# Patient Record
Sex: Male | Born: 1995 | Race: Black or African American | Hispanic: No | Marital: Single | Smoking: Never smoker
Health system: Southern US, Community
[De-identification: ages and names within clinical notes are randomized; demographics above are authoritative.]

---
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
## 2017-01-02 ENCOUNTER — Encounter: Payer: Self-pay | Admitting: Family Medicine

## 2017-01-02 ENCOUNTER — Ambulatory Visit (INDEPENDENT_AMBULATORY_CARE_PROVIDER_SITE_OTHER): Payer: Managed Care, Other (non HMO) | Admitting: Family Medicine

## 2017-01-02 DIAGNOSIS — L989 Disorder of the skin and subcutaneous tissue, unspecified: Secondary | ICD-10-CM

## 2017-01-02 NOTE — Progress Notes (Signed)
Patient presents today for follow-up regarding lesion on right antecubital area. Cryotherapy was used on the area however has not worked. I have discussed with the patient that I will refer him to dermatology to have the area excised. Patient just understanding. Will follow up with me if he has any acute problems.

## 2017-02-11 ENCOUNTER — Ambulatory Visit
Admission: RE | Admit: 2017-02-11 | Discharge: 2017-02-11 | Disposition: A | Discharge status: Home / Self Care | Payer: 59 | Source: Ambulatory Visit | Attending: Family Medicine | Admitting: Family Medicine

## 2017-02-11 ENCOUNTER — Other Ambulatory Visit: Payer: Self-pay | Admitting: Family Medicine

## 2017-02-11 DIAGNOSIS — Y9361 Activity, american tackle football: Secondary | ICD-10-CM | POA: Diagnosis not present

## 2017-02-11 DIAGNOSIS — R937 Abnormal findings on diagnostic imaging of other parts of musculoskeletal system: Secondary | ICD-10-CM | POA: Insufficient documentation

## 2017-02-11 DIAGNOSIS — R609 Edema, unspecified: Secondary | ICD-10-CM

## 2017-02-11 DIAGNOSIS — R52 Pain, unspecified: Secondary | ICD-10-CM

## 2017-02-11 DIAGNOSIS — X58XXXA Exposure to other specified factors, initial encounter: Secondary | ICD-10-CM | POA: Insufficient documentation

## 2017-02-11 DIAGNOSIS — M7989 Other specified soft tissue disorders: Secondary | ICD-10-CM | POA: Insufficient documentation

## 2017-02-11 DIAGNOSIS — M79644 Pain in right finger(s): Secondary | ICD-10-CM | POA: Insufficient documentation

## 2017-02-12 ENCOUNTER — Ambulatory Visit (INDEPENDENT_AMBULATORY_CARE_PROVIDER_SITE_OTHER): Payer: 59 | Admitting: Family Medicine

## 2017-02-12 ENCOUNTER — Encounter: Payer: Self-pay | Admitting: Family Medicine

## 2017-02-12 DIAGNOSIS — S6010XA Contusion of unspecified finger with damage to nail, initial encounter: Secondary | ICD-10-CM

## 2017-02-12 NOTE — Progress Notes (Signed)
Patient presents with right third digit pain. A helmet hit the digit 2 days ago. He has been having some throbbing under the right third nail. Denies any other injury. Has taken Ibuprofen for his pain. Xray was done to evaluate for fracture.   ROS: Negative except mentioned above. Vitals as per Epic.  GENERAL: NAD MSK: R Third Digit- subungal hematoma, tenderness on palpation at this site only, small abrasion at the cuticle area, no signs of infection, FROM at PIP and DIP, nv intact NEURO: CN II-XII grossly intact    A/P: R Third Digit Subungal Hematoma - risk/benefits were discussed, verbal consent given, local anesthetic used, electrocautery used to express blood from nail, patient expressed pain relief, dressing placed, monitor for infection, seek medical attention if any further problems. Tylenol  given to patient before discharge.

## 2017-07-17 ENCOUNTER — Ambulatory Visit (INDEPENDENT_AMBULATORY_CARE_PROVIDER_SITE_OTHER): Payer: 59 | Admitting: Family Medicine

## 2017-07-17 DIAGNOSIS — H5789 Other specified disorders of eye and adnexa: Secondary | ICD-10-CM

## 2017-07-17 MED ORDER — POLYMYXIN B-TRIMETHOPRIM 10000-0.1 UNIT/ML-% OP SOLN
1.0000 [drp] | Freq: Four times a day (QID) | OPHTHALMIC | 0 refills | Status: DC
Start: 2017-07-17 — End: 2019-04-26

## 2017-07-17 NOTE — Progress Notes (Signed)
Patient presents today with symptoms of right eye redness and tearing. Patient states that the symptoms started yesterday. He does wear contact lenses that are monthly. He denies having any issues with his contact lenses and admits to taking them out nightly. He denies feeling like there is anything in his eye. He denies any vision problems. He has his contact lens in his left eye but not his right today. He denies any URI symptoms.  ROS: Negative except mentioned above. Vitals as per Epic. GENERAL: NAD HEENT: no pharyngeal erythema, no exudate, PERRL, EOMI, right eye mild conjunctival erythema, no obvious abrasion or foreign body appreciated, no significant erythema to the left eye NEURO: CN II-XII grossly intact   A/P: Right eye irritation - discussed with patient that could have a small conjunctival abrasion, will prescribe Polytrim and advised patient to wash hands frequently, should take Claritin or Zyrtec once daily for the next few days, if symptoms do persist or worsen will have patient follow-up with eye doctor. No weight room activities today. Do not wear contact lens in the right eye until symptoms have resolved. He will seek medical attention if any acute issues.

## 2018-10-20 ENCOUNTER — Other Ambulatory Visit: Payer: Self-pay | Admitting: Hematology

## 2018-10-20 DIAGNOSIS — Z20822 Contact with and (suspected) exposure to covid-19: Secondary | ICD-10-CM

## 2018-10-20 NOTE — Progress Notes (Signed)
COVID screening per Dr. Allena Katz (PCP).  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

## 2018-10-21 ENCOUNTER — Other Ambulatory Visit: Payer: PRIVATE HEALTH INSURANCE

## 2018-10-21 DIAGNOSIS — Z20822 Contact with and (suspected) exposure to covid-19: Secondary | ICD-10-CM

## 2018-10-22 LAB — NOVEL CORONAVIRUS, NAA: SARS-CoV-2, NAA: NOT DETECTED

## 2018-11-25 ENCOUNTER — Other Ambulatory Visit: Payer: Self-pay | Admitting: *Deleted

## 2018-11-25 DIAGNOSIS — Z20822 Contact with and (suspected) exposure to covid-19: Secondary | ICD-10-CM

## 2018-11-29 ENCOUNTER — Other Ambulatory Visit: Payer: Self-pay

## 2018-11-29 DIAGNOSIS — Z20822 Contact with and (suspected) exposure to covid-19: Secondary | ICD-10-CM

## 2018-12-07 ENCOUNTER — Other Ambulatory Visit: Payer: Self-pay | Admitting: Family Medicine

## 2018-12-07 DIAGNOSIS — Z20822 Contact with and (suspected) exposure to covid-19: Secondary | ICD-10-CM

## 2018-12-07 DIAGNOSIS — Z20828 Contact with and (suspected) exposure to other viral communicable diseases: Secondary | ICD-10-CM

## 2018-12-10 ENCOUNTER — Other Ambulatory Visit: Payer: Self-pay | Admitting: Family Medicine

## 2018-12-10 LAB — NOVEL CORONAVIRUS, NAA
SARS-CoV-2, NAA: NOT DETECTED
SARS-CoV-2, NAA: NOT DETECTED

## 2019-01-03 ENCOUNTER — Other Ambulatory Visit: Payer: Self-pay

## 2019-01-03 DIAGNOSIS — Z20822 Contact with and (suspected) exposure to covid-19: Secondary | ICD-10-CM

## 2019-01-04 LAB — NOVEL CORONAVIRUS, NAA: SARS-CoV-2, NAA: NOT DETECTED

## 2019-01-05 ENCOUNTER — Telehealth: Payer: Self-pay | Admitting: Family Medicine

## 2019-01-05 ENCOUNTER — Telehealth: Payer: Self-pay

## 2019-01-05 NOTE — Telephone Encounter (Signed)
Negative COVID results given. Patient results "NOT Detected." Caller expressed understanding. ° °

## 2019-01-05 NOTE — Telephone Encounter (Signed)
Left message for Patient to call back to receive covid-19 testing results. Provided number to call.

## 2019-02-14 ENCOUNTER — Other Ambulatory Visit: Payer: Self-pay

## 2019-02-14 DIAGNOSIS — Z20822 Contact with and (suspected) exposure to covid-19: Secondary | ICD-10-CM

## 2019-02-16 LAB — NOVEL CORONAVIRUS, NAA: SARS-CoV-2, NAA: NOT DETECTED

## 2019-04-25 ENCOUNTER — Other Ambulatory Visit: Payer: Self-pay

## 2019-04-25 ENCOUNTER — Ambulatory Visit
Admission: RE | Admit: 2019-04-25 | Discharge: 2019-04-25 | Disposition: A | Discharge status: Home / Self Care | Payer: PRIVATE HEALTH INSURANCE | Source: Ambulatory Visit | Attending: Family Medicine | Admitting: Family Medicine

## 2019-04-25 ENCOUNTER — Other Ambulatory Visit: Payer: Self-pay | Admitting: Family Medicine

## 2019-04-25 DIAGNOSIS — R52 Pain, unspecified: Secondary | ICD-10-CM

## 2019-04-25 DIAGNOSIS — R609 Edema, unspecified: Secondary | ICD-10-CM | POA: Diagnosis present

## 2019-04-26 ENCOUNTER — Encounter: Payer: Self-pay | Admitting: Family Medicine

## 2019-04-26 ENCOUNTER — Ambulatory Visit: Payer: Self-pay | Admitting: Family Medicine

## 2019-04-26 DIAGNOSIS — Z113 Encounter for screening for infections with a predominantly sexual mode of transmission: Secondary | ICD-10-CM

## 2019-04-26 LAB — GRAM STAIN

## 2019-04-26 NOTE — Progress Notes (Signed)
Gram stain reviewed and is negative today so no treatment needed per standing order. Provider orders completed.Ronny Bacon, RN

## 2019-04-26 NOTE — Progress Notes (Signed)
    STI clinic/screening visit  Subjective:  Scott Bailey is a 23 y.o. male being seen today for an STI screening visit. The patient reports they do not have symptoms.  Patient reports that they do not desire a pregnancy in the next year.   They reported they are not interested in discussing contraception today.   Patient has the following medical conditions:  There are no active problems to display for this patient.  Chief Complaint  Patient presents with  . SEXUALLY TRANSMITTED DISEASE    HPI  Patient reports he is here for STD screening.  Client denies sympts.  See flowsheet for further details and programmatic requirements.    The following portions of the patient's history were reviewed and updated as appropriate: allergies, current medications, past medical history, past social history, past surgical history and problem list.  Objective:  There were no vitals filed for this visit.  Physical Exam Constitutional:      Appearance: Normal appearance.  Neck:     Musculoskeletal: Neck supple. No muscular tenderness.  Abdominal:     Palpations: Abdomen is soft.     Tenderness: There is no abdominal tenderness.  Genitourinary:    Penis: Normal.      Scrotum/Testes: Normal.     Comments: No disch noted Lymphadenopathy:     Cervical: No cervical adenopathy.  Skin:    General: Skin is warm and dry.     Findings: No lesion or rash.  Neurological:     Mental Status: He is alert.    Assessment and Plan:  Scott Bailey is a 23 y.o. male presenting to the [facility] Department for STI screening  1. Screening examination for venereal disease  - Gram stain - Gonococcus culture                                   Client declines bloodwork today Treat gram stain as per SO., Condoms always for STD prevention    No follow-ups on file.  No future appointments.  Hassell Done, FNP

## 2019-05-03 LAB — GONOCOCCUS CULTURE

## 2019-09-14 ENCOUNTER — Other Ambulatory Visit: Payer: Self-pay | Admitting: Family

## 2019-09-14 DIAGNOSIS — U071 COVID-19: Secondary | ICD-10-CM

## 2019-09-14 NOTE — Progress Notes (Signed)
Order for Complete Echo and Troponin level sent per [employer] and Sports Med protocol for Covid 19 positive athletes to return to play. 

## 2019-09-22 ENCOUNTER — Other Ambulatory Visit: Payer: Self-pay

## 2019-09-22 ENCOUNTER — Other Ambulatory Visit
Admission: RE | Admit: 2019-09-22 | Discharge: 2019-09-22 | Disposition: A | Discharge status: Home / Self Care | Payer: PRIVATE HEALTH INSURANCE | Source: Ambulatory Visit | Attending: Family | Admitting: Family

## 2019-09-22 ENCOUNTER — Ambulatory Visit (INDEPENDENT_AMBULATORY_CARE_PROVIDER_SITE_OTHER): Payer: PRIVATE HEALTH INSURANCE

## 2019-09-22 DIAGNOSIS — U071 COVID-19: Secondary | ICD-10-CM | POA: Diagnosis present

## 2019-09-22 LAB — TROPONIN I (HIGH SENSITIVITY): Troponin I (High Sensitivity): 2 ng/L (ref <18)

## 2020-02-13 ENCOUNTER — Other Ambulatory Visit: Payer: Self-pay

## 2020-02-13 ENCOUNTER — Ambulatory Visit
Admission: RE | Admit: 2020-02-13 | Discharge: 2020-02-13 | Disposition: A | Discharge status: Home / Self Care | Payer: BLUE CROSS/BLUE SHIELD | Source: Ambulatory Visit | Attending: Sports Medicine | Admitting: Sports Medicine

## 2020-02-13 ENCOUNTER — Other Ambulatory Visit: Payer: Self-pay | Admitting: Sports Medicine

## 2020-02-13 ENCOUNTER — Ambulatory Visit
Admission: RE | Admit: 2020-02-13 | Discharge: 2020-02-13 | Disposition: A | Discharge status: Home / Self Care | Payer: BLUE CROSS/BLUE SHIELD | Attending: Sports Medicine | Admitting: Sports Medicine

## 2020-02-13 DIAGNOSIS — R52 Pain, unspecified: Secondary | ICD-10-CM | POA: Diagnosis present

## 2020-08-27 IMAGING — CR DG KNEE COMPLETE 4+V*L*
1 series · 4 of 4 positions shown · non-contrast
Comparison: 09/15/2011

CLINICAL DATA: History of injury 1 month ago during football
practice still with pain in the medial knee about the patella.

EXAM:
LEFT KNEE - COMPLETE 4+ VIEW

[Series 1: dg knee complete 4 views left · 0.14mm/px · 4 of 4 slices shown]
[im 1/4]
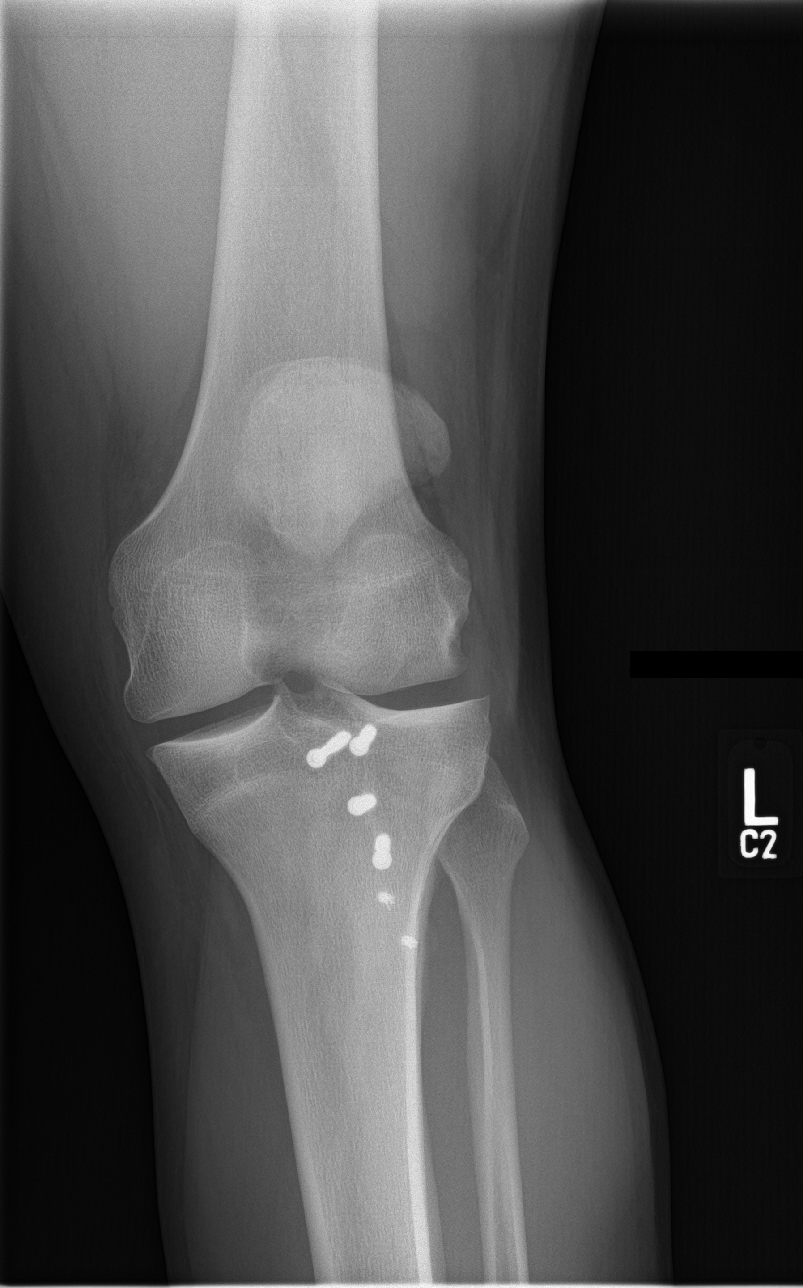
[im 2/4]
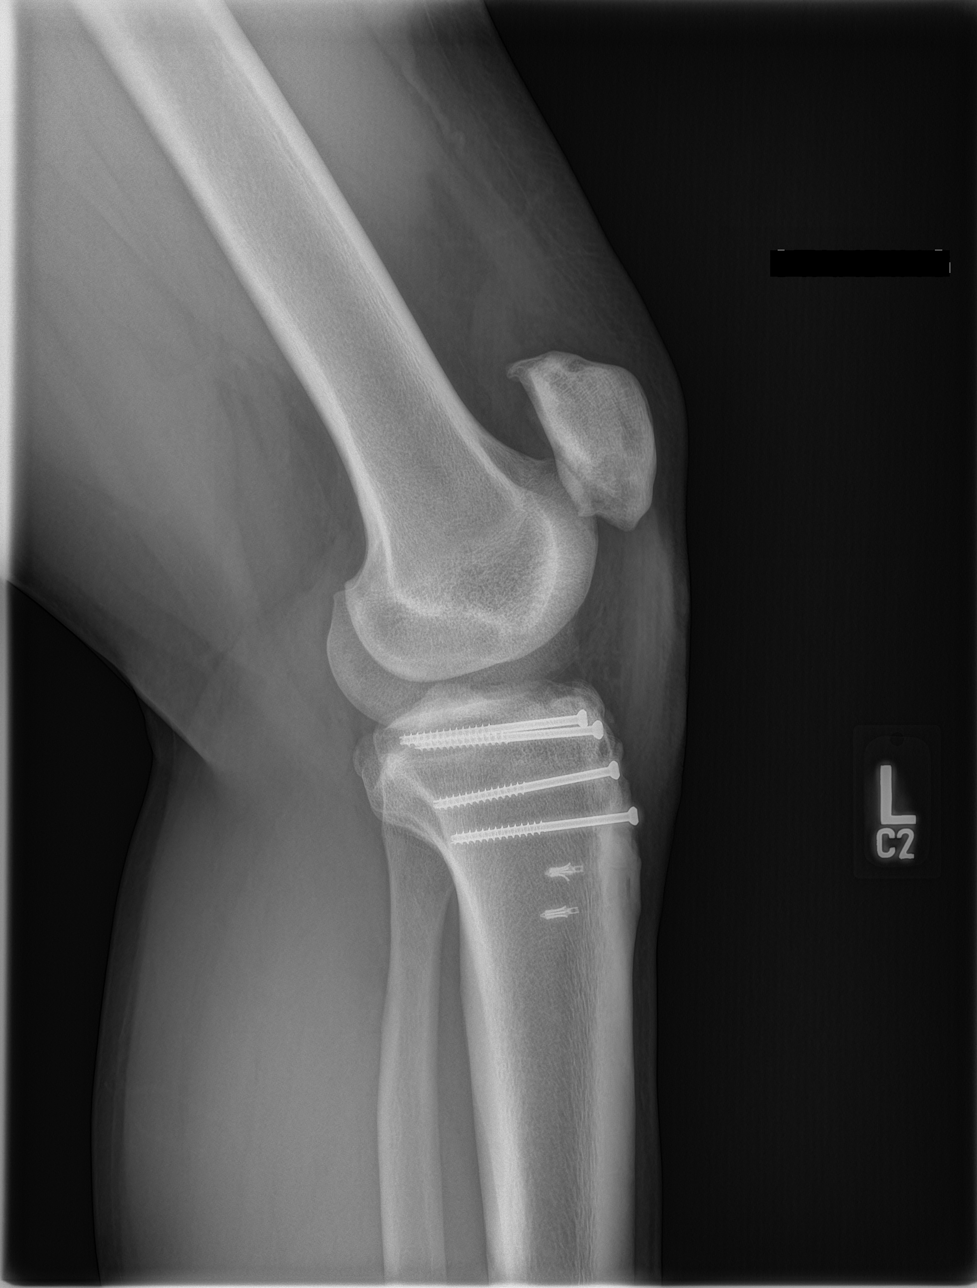
[im 3/4]
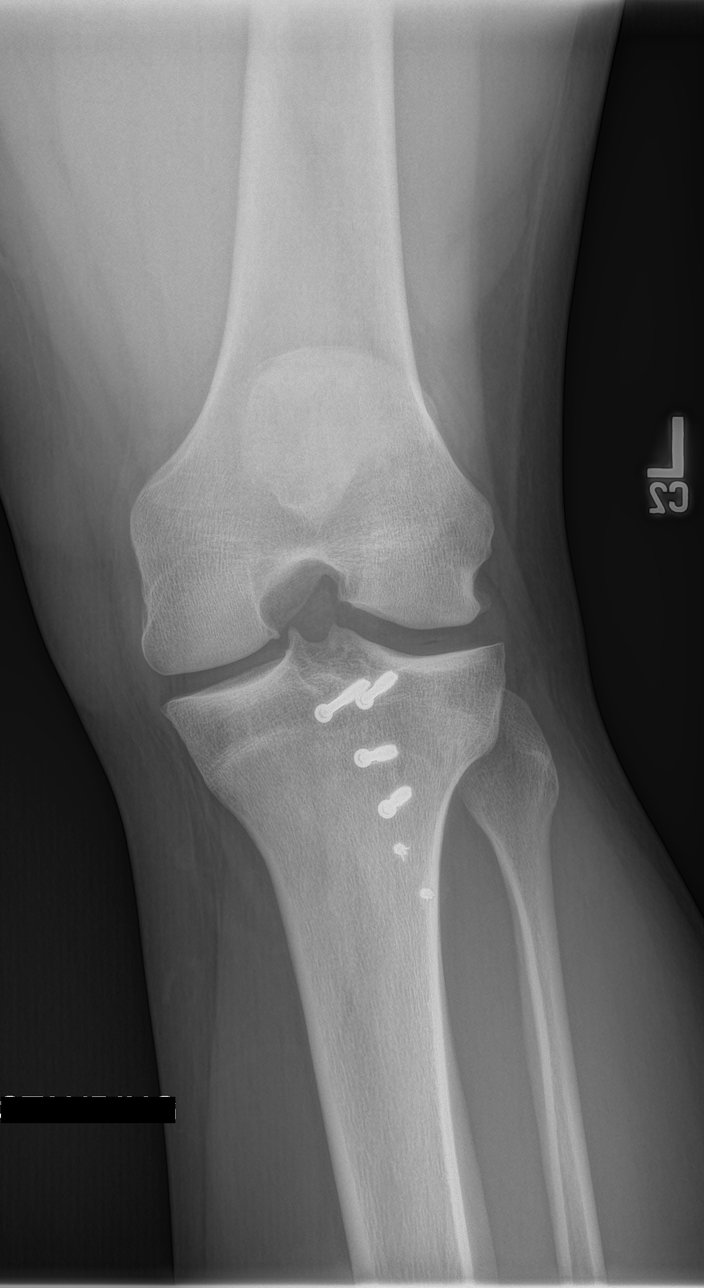
[im 4/4]
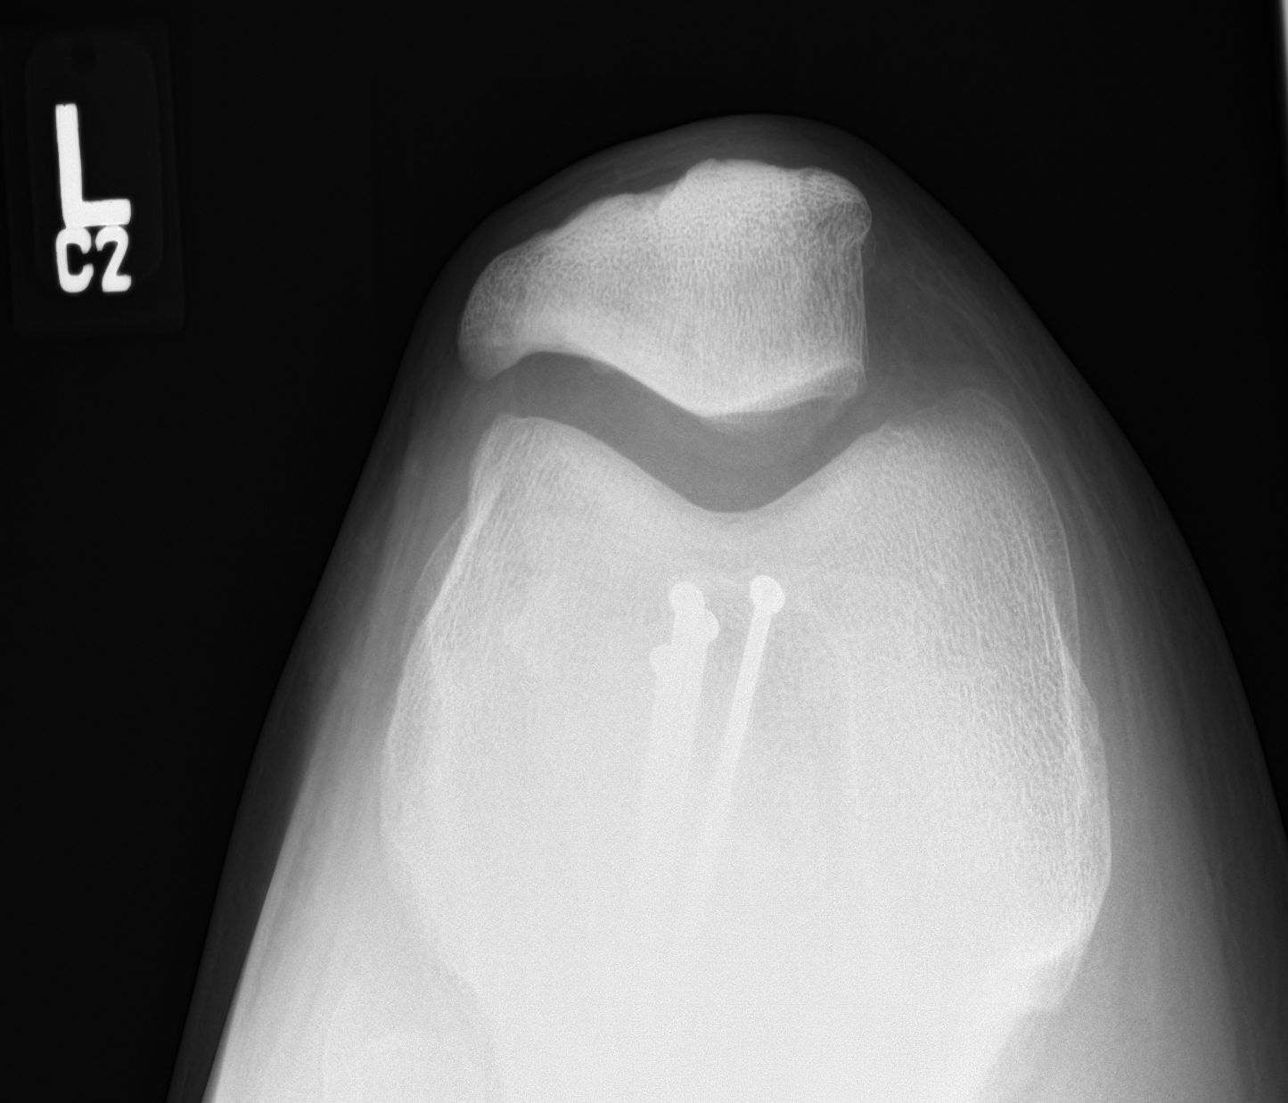

[4 of 4 positions shown; findings below may reference images not displayed]

FINDINGS: Signs of previous postoperative change in the tibia with similar
appearance.

Infrapatellar fat stranding. Stranding along the expected course of
the patellar tendon extending toward tibial plateau.

No signs of acute fracture. Signs of patellofemoral degenerative
change.
IMPRESSION: Similar appearance of postoperative changes with some edema along
the course of the patellar tendon and in the infrapatellar recess.
No signs of fracture. Soft tissue injury is considered. Position of
patella is unchanged from prior study.

## 2021-06-14 ENCOUNTER — Ambulatory Visit: Payer: Self-pay | Admitting: Family Medicine

## 2021-06-14 ENCOUNTER — Other Ambulatory Visit: Payer: Self-pay

## 2021-06-14 ENCOUNTER — Encounter: Payer: Self-pay | Admitting: Family Medicine

## 2021-06-14 DIAGNOSIS — N341 Nonspecific urethritis: Secondary | ICD-10-CM

## 2021-06-14 DIAGNOSIS — Z113 Encounter for screening for infections with a predominantly sexual mode of transmission: Secondary | ICD-10-CM

## 2021-06-14 LAB — GRAM STAIN

## 2021-06-14 MED ORDER — DOXYCYCLINE HYCLATE 100 MG PO TABS: 100.0000 mg | ORAL_TABLET | Freq: Two times a day (BID) | ORAL | 0 refills | Status: AC

## 2021-06-14 MED ORDER — DOXYCYCLINE HYCLATE 100 MG PO TABS: 100.0000 mg | ORAL_TABLET | Freq: Two times a day (BID) | ORAL | 0 refills | Status: DC

## 2021-06-14 NOTE — Progress Notes (Signed)
[facility] Department STI clinic/screening visit  Subjective:  Scott Bailey is a 26 y.o. male being seen today for an STI screening visit. The patient reports they do not have symptoms.    Patient has the following medical conditions:  There are no problems to display for this patient.    Chief Complaint  Patient presents with   SEXUALLY TRANSMITTED DISEASE    Screening     HPI  Patient reports here for screening   Does the patient or their partner desires a pregnancy in the next year? No  Screening for MPX risk: Does the patient have an unexplained rash? No Is the patient MSM? No Does the patient endorse multiple sex partners or anonymous sex partners? No Did the patient have close or sexual contact with a person diagnosed with MPX? No Has the patient traveled outside the [place] where MPX is endemic? No Is there a high clinical suspicion for MPX-- evidenced by one of the following No  -Unlikely to be chickenpox  -Lymphadenopathy  -Rash that present in same phase of evolution on any given body part   See flowsheet for further details and programmatic requirements.    The following portions of the patient's history were reviewed and updated as appropriate: allergies, current medications, past medical history, past social history, past surgical history and problem list.  Objective:  There were no vitals filed for this visit.  Physical Exam Constitutional:      Appearance: Normal appearance.  HENT:     Head: Normocephalic.     Mouth/Throat:     Mouth: Mucous membranes are moist.     Pharynx: Oropharynx is clear. No oropharyngeal exudate.  Pulmonary:     Effort: Pulmonary effort is normal.  Genitourinary:    Penis: Normal.      Testes: Normal.     Comments: No lice, nits, or pest, no lesions or odor discharge.  Denies pain or tenderness with paplation of testicles.  No lesions, ulcers or masses present.    Musculoskeletal:     Cervical back: Normal  range of motion.  Lymphadenopathy:     Cervical: No cervical adenopathy.  Skin:    General: Skin is warm and dry.     Findings: No bruising, erythema, lesion or rash.  Neurological:     Mental Status: He is alert and oriented to person, place, and time.  Psychiatric:        Mood and Affect: Mood normal.        Behavior: Behavior normal.      Assessment and Plan:  Scott Bailey is a 26 y.o. male presenting to the [facility] Department for STI screening  1. Screening examination for venereal disease Patient does not have STI symptoms Patient accepted all screenings including  gram stain,  oral, urethral GC and bloodwork for HIV/RPR.  Patient meets criteria for HepB screening? No. Ordered? No - does not meet criteria  Patient meets criteria for HepC screening? No. Ordered? No - does not meet criteria  Recommended condom use with all sex Discussed importance of condom use for STI prevent  - Gram stain - Gonococcus culture - Gonococcus culture  2. NGU (nongonococcal urethritis) Gram stain >2 wbcs - treated for NGU  - doxycycline (VIBRA-TABS) 100 MG tablet; Take 1 tablet (100 mg total) by mouth 2 (two) times daily for 7 days.  Dispense: 14 tablet; Refill: 0  Discussed time line for [facility] results and that patient will be called with  positive results and encouraged patient to call if he had not heard in 2 weeks Recommended returning for continued or worsening symptoms.     Return for as needed.  No future appointments.  Junious Dresser, FNP

## 2021-06-19 LAB — GONOCOCCUS CULTURE

## 2022-01-25 ENCOUNTER — Emergency Department (HOSPITAL_COMMUNITY)
Admission: EM | Admit: 2022-01-25 | Discharge: 2022-01-25 | Discharge status: Absent without leave | Payer: PRIVATE HEALTH INSURANCE
